# Patient Record
Sex: Male | Born: 1960 | Race: White | Hispanic: No | Marital: Married | Smoking: Current every day smoker
Health system: Southern US, Community
[De-identification: ages and names within clinical notes are randomized; demographics above are authoritative.]

## PROBLEM LIST (undated history)

## (undated) DIAGNOSIS — F1020 Alcohol dependence, uncomplicated: Secondary | ICD-10-CM

## (undated) DIAGNOSIS — J449 Chronic obstructive pulmonary disease, unspecified: Secondary | ICD-10-CM

## (undated) DIAGNOSIS — I10 Essential (primary) hypertension: Secondary | ICD-10-CM

## (undated) DIAGNOSIS — F419 Anxiety disorder, unspecified: Secondary | ICD-10-CM

## (undated) DIAGNOSIS — E119 Type 2 diabetes mellitus without complications: Secondary | ICD-10-CM

## (undated) DIAGNOSIS — J45909 Unspecified asthma, uncomplicated: Secondary | ICD-10-CM

## (undated) DIAGNOSIS — F32A Depression, unspecified: Secondary | ICD-10-CM

## (undated) HISTORY — DX: Anxiety disorder, unspecified: F41.9

## (undated) HISTORY — DX: Type 2 diabetes mellitus without complications: E11.9

## (undated) HISTORY — DX: Alcohol dependence, uncomplicated: F10.20

## (undated) HISTORY — DX: Chronic obstructive pulmonary disease, unspecified: J44.9

## (undated) HISTORY — DX: Depression, unspecified: F32.A

---

## 2005-09-20 DIAGNOSIS — I82409 Acute embolism and thrombosis of unspecified deep veins of unspecified lower extremity: Secondary | ICD-10-CM

## 2005-09-20 HISTORY — DX: Acute embolism and thrombosis of unspecified deep veins of unspecified lower extremity: I82.409

## 2019-06-29 ENCOUNTER — Encounter: Payer: Self-pay | Admitting: Emergency Medicine

## 2019-06-29 ENCOUNTER — Other Ambulatory Visit: Payer: Self-pay

## 2019-06-29 ENCOUNTER — Emergency Department
Admission: EM | Admit: 2019-06-29 | Discharge: 2019-06-30 | Disposition: A | Discharge status: Home / Self Care | Payer: Self-pay | Attending: Emergency Medicine | Admitting: Emergency Medicine

## 2019-06-29 ENCOUNTER — Emergency Department: Payer: Self-pay

## 2019-06-29 DIAGNOSIS — Z046 Encounter for general psychiatric examination, requested by authority: Secondary | ICD-10-CM | POA: Insufficient documentation

## 2019-06-29 DIAGNOSIS — F10921 Alcohol use, unspecified with intoxication delirium: Secondary | ICD-10-CM | POA: Insufficient documentation

## 2019-06-29 DIAGNOSIS — J441 Chronic obstructive pulmonary disease with (acute) exacerbation: Secondary | ICD-10-CM | POA: Insufficient documentation

## 2019-06-29 DIAGNOSIS — I10 Essential (primary) hypertension: Secondary | ICD-10-CM | POA: Insufficient documentation

## 2019-06-29 DIAGNOSIS — Z7901 Long term (current) use of anticoagulants: Secondary | ICD-10-CM | POA: Insufficient documentation

## 2019-06-29 DIAGNOSIS — Z79899 Other long term (current) drug therapy: Secondary | ICD-10-CM | POA: Insufficient documentation

## 2019-06-29 DIAGNOSIS — Y908 Blood alcohol level of 240 mg/100 ml or more: Secondary | ICD-10-CM | POA: Insufficient documentation

## 2019-06-29 DIAGNOSIS — R600 Localized edema: Secondary | ICD-10-CM | POA: Insufficient documentation

## 2019-06-29 DIAGNOSIS — R45851 Suicidal ideations: Secondary | ICD-10-CM | POA: Insufficient documentation

## 2019-06-29 HISTORY — DX: Essential (primary) hypertension: I10

## 2019-06-29 HISTORY — DX: Unspecified asthma, uncomplicated: J45.909

## 2019-06-29 LAB — COMPREHENSIVE METABOLIC PANEL
ALT: 26 U/L (ref 0–44)
AST: 47 U/L — ABNORMAL HIGH (ref 15–41)
Albumin: 4.2 g/dL (ref 3.5–5.0)
Alkaline Phosphatase: 113 U/L (ref 38–126)
Anion gap: 15 (ref 5–15)
BUN: 8 mg/dL (ref 6–20)
CO2: 23 mmol/L (ref 22–32)
Calcium: 8.9 mg/dL (ref 8.9–10.3)
Chloride: 101 mmol/L (ref 98–111)
Creatinine, Ser: 0.61 mg/dL (ref 0.61–1.24)
GFR calc Af Amer: >60 mL/min (ref >60)
GFR calc non Af Amer: >60 mL/min (ref >60)
Glucose, Bld: 103 mg/dL — ABNORMAL HIGH (ref 70–99)
Potassium: 3.8 mmol/L (ref 3.5–5.1)
Sodium: 139 mmol/L (ref 135–145)
Total Bilirubin: 0.6 mg/dL (ref 0.3–1.2)
Total Protein: 7.9 g/dL (ref 6.5–8.1)

## 2019-06-29 LAB — URINE DRUG SCREEN, QUALITATIVE (ARMC ONLY)
Amphetamines, Ur Screen: NOT DETECTED
Barbiturates, Ur Screen: NOT DETECTED
Benzodiazepine, Ur Scrn: POSITIVE — AB
Cannabinoid 50 Ng, Ur ~~LOC~~: NOT DETECTED
Cocaine Metabolite,Ur ~~LOC~~: NOT DETECTED
MDMA (Ecstasy)Ur Screen: NOT DETECTED
Methadone Scn, Ur: NOT DETECTED
Opiate, Ur Screen: NOT DETECTED
Phencyclidine (PCP) Ur S: NOT DETECTED
Tricyclic, Ur Screen: NOT DETECTED

## 2019-06-29 LAB — CBC
HCT: 45.5 % (ref 39.0–52.0)
Hemoglobin: 15.6 g/dL (ref 13.0–17.0)
MCH: 32.3 pg (ref 26.0–34.0)
MCHC: 34.3 g/dL (ref 30.0–36.0)
MCV: 94.2 fL (ref 80.0–100.0)
Platelets: 119 10*3/uL — ABNORMAL LOW (ref 150–400)
RBC: 4.83 MIL/uL (ref 4.22–5.81)
RDW: 15.5 % (ref 11.5–15.5)
WBC: 6.1 10*3/uL (ref 4.0–10.5)
nRBC: 0 % (ref 0.0–0.2)

## 2019-06-29 LAB — LACTIC ACID, PLASMA
Lactic Acid, Venous: 2.5 mmol/L (ref 0.5–1.9)
Lactic Acid, Venous: 3 mmol/L (ref 0.5–1.9)

## 2019-06-29 LAB — ETHANOL: Alcohol, Ethyl (B): 355 mg/dL (ref <10)

## 2019-06-29 LAB — PROTIME-INR
INR: 5 (ref 0.8–1.2)
Prothrombin Time: 45.6 seconds — ABNORMAL HIGH (ref 11.4–15.2)

## 2019-06-29 MED ORDER — IPRATROPIUM-ALBUTEROL 0.5-2.5 (3) MG/3ML IN SOLN
3.0000 mL | Freq: Once | RESPIRATORY_TRACT | Status: AC
  Administered 2019-06-29: 3 mL via RESPIRATORY_TRACT
  Filled 2019-06-29: qty 3

## 2019-06-29 MED ORDER — METHYLPREDNISOLONE SODIUM SUCC 125 MG IJ SOLR
125.0000 mg | Freq: Once | INTRAMUSCULAR | Status: AC
  Administered 2019-06-29: 125 mg via INTRAVENOUS
  Filled 2019-06-29: qty 2

## 2019-06-29 MED ORDER — ALBUTEROL SULFATE (2.5 MG/3ML) 0.083% IN NEBU
2.5000 mg | INHALATION_SOLUTION | Freq: Once | RESPIRATORY_TRACT | Status: AC
  Administered 2019-06-29: 19:00:00 2.5 mg via RESPIRATORY_TRACT
  Filled 2019-06-29: qty 3

## 2019-06-29 MED ORDER — SODIUM CHLORIDE 0.9 % IV BOLUS
1000.0000 mL | Freq: Once | INTRAVENOUS | Status: AC
  Administered 2019-06-29: 1000 mL via INTRAVENOUS

## 2019-06-29 NOTE — ED Notes (Signed)
Pharmacy tech talking to patient.

## 2019-06-29 NOTE — ED Notes (Signed)
Pt sitting up and eating dinner tray at this time

## 2019-06-29 NOTE — ED Notes (Signed)
IVC/Consult ordered ?

## 2019-06-29 NOTE — ED Notes (Signed)
Jay Robinson, pt PCP called to update and advocate for pt admission. Speaking with MD Siadecki at this time

## 2019-06-29 NOTE — ED Triage Notes (Signed)
Pt via EMS from home with SI and + ETOH intoxication today. Per EMS pt was found to be combative and threatening on scene. EMS received orders from EDP for 10mg  of haldol and 5mg  of versed IM in route. Pt is alert and agitated with slurred speech noted upon arrival. VSS, pt is on 2L from meds given.

## 2019-06-29 NOTE — ED Notes (Addendum)
Inhaler from bag given to pt per pt request and MD Siadecki request , inhaler is empty. MD aware

## 2019-06-29 NOTE — ED Notes (Signed)
Lab called and reported an latic of 3.0, MD informed orders issued.

## 2019-06-29 NOTE — ED Notes (Signed)
ED Provider at bedside. 

## 2019-06-29 NOTE — ED Notes (Signed)
Pt. Used bed side urinal.  Pt. Also given an additional cup of ice upon request.

## 2019-06-29 NOTE — ED Notes (Addendum)
Pt belongings consist of : zip lock bag full of bible, keys, 2 packs of cigarettes, inhaler, wallet, glasses, black cord and white cord , cell phone, papers, and sandals, sweat pants, tshirt, and 2 silver rings, 1 gold wedding band

## 2019-06-29 NOTE — ED Notes (Signed)
Pt denies any SI/HI or drug use with this RN

## 2019-06-29 NOTE — ED Provider Notes (Signed)
[facility] Emergency Department Provider Note ____________________________________________   First MD Initiated Contact with Patient 06/29/19 1638     (approximate)  I have reviewed the triage vital signs and the nursing notes.   HISTORY  Chief Complaint Suicidal and Alcohol Intoxication  Level 5 caveat: History of present illness limited due to alcohol intoxication  HPI Jay Robinson is a 58 y.o. male with a history of alcohol abuse as well as hypertension, diabetes, COPD, and right lower extremity DVT on Coumadin presents with alcohol intoxication.  Per EMS, the patient was combative and threatening at the scene and required sedation with 10 mg of Haldol and 5 mg of Versed.  Apparently he had some suicidal ideation expressed to EMS, but denies it currently.  The patient has right lower extremity swelling and states that his been there for about 2 weeks.  He went to [facility] to be evaluated but does not remember what they told him.  He denies any other acute medical complaints.  Past Medical History:  Diagnosis Date  . Asthma   . Hypertension     There are no active problems to display for this patient.   History reviewed. No pertinent surgical history.  Prior to Admission medications   Medication Sig Start Date End Date Taking? Authorizing Provider  albuterol (VENTOLIN HFA) 108 (90 Base) MCG/ACT inhaler Inhale 2 puffs into the lungs every 6 (six) hours as needed. 11/08/12  Yes [provider]  budesonide-formoterol (SYMBICORT) 160-4.5 MCG/ACT inhaler Inhale 2 puffs into the lungs 2 (two) times daily.   Yes [provider]  desvenlafaxine (PRISTIQ) 50 MG 24 hr tablet Take 50 mg by mouth daily.   Yes [provider]  LORazepam (ATIVAN) 1 MG tablet 1-2 tabs Q4 PRN anxiety 05/09/19  Yes [provider]  metoprolol succinate (TOPROL-XL) 25 MG 24 hr tablet Take 25 mg by mouth Nightly.   Yes [provider]   mirtazapine (REMERON) 7.5 MG tablet Take 7.5 mg by mouth at bedtime.   Yes [provider]  Multiple Vitamin (MULTIVITAMIN WITH MINERALS) TABS tablet Take 1 tablet by mouth daily.   Yes [provider]  tiotropium (SPIRIVA) 18 MCG inhalation capsule Place 18 mcg into inhaler and inhale daily.   Yes [provider]  vitamin B-12 (CYANOCOBALAMIN) 1000 MCG tablet Take 1,000 mcg by mouth daily.   Yes [provider]  warfarin (COUMADIN) 7.5 MG tablet Take 7.5 mg by mouth every evening.   Yes [provider]    Allergies Aspirin and Penicillins  No family history on file.  Social History Social History   Tobacco Use  . Smoking status: Current Every Day Smoker    Types: Cigarettes  . Smokeless tobacco: Never Used  Substance Use Topics  . Alcohol use: Yes    Comment: every day   . Drug use: Not Currently    Review of Systems Level 5 caveat: Unable to obtain review of systems due to alcohol intoxication   ____________________________________________   PHYSICAL EXAM:  VITAL SIGNS: ED Triage Vitals  Enc Vitals Group     BP 06/29/19 1628 123/74     Pulse Rate 06/29/19 1626 95     Resp 06/29/19 1626 14     Temp 06/29/19 1628 (!) 97.5 F (36.4 C)     Temp Source 06/29/19 1628 Oral     SpO2 06/29/19 1626 (!) 88 %     Weight --      Height --  Head Circumference --      Peak Flow --      Pain Score 06/29/19 1628 0     Pain Loc --      Pain Edu? --      Excl. in [place]? --     Constitutional: Sleepy appearing, somewhat combative, but in no acute distress. Eyes: Conjunctivae are normal.  EOMI. Head: Atraumatic. Nose: No congestion/rhinnorhea. Mouth/Throat: Mucous membranes are dry.   Neck: Normal range of motion.  Cardiovascular: Normal rate, regular rhythm. Good peripheral circulation. Respiratory: Normal respiratory effort.  No retractions. Gastrointestinal: Soft and nontender.  Musculoskeletal: Extremities warm and well  perfused.  Right lower extremity with 2+ edema below the knee, erythema, and induration.  2+ DP pulses bilaterally.  No wounds or skin lesions. Neurologic: Slurred speech.  Motor intact in all extremities. Skin:  Skin is warm and dry. No rash noted. Psychiatric: Sleepy but labile and intermittently agitated.  ____________________________________________   LABS (all labs ordered are listed, but only abnormal results are displayed)  Labs Reviewed  COMPREHENSIVE METABOLIC PANEL - Abnormal; Notable for the following components:      Result Value   Glucose, Bld 103 (*)    AST 47 (*)    All other components within normal limits  ETHANOL - Abnormal; Notable for the following components:   Alcohol, Ethyl (B) 355 (*)    All other components within normal limits  CBC - Abnormal; Notable for the following components:   Platelets 119 (*)    All other components within normal limits  URINE DRUG SCREEN, QUALITATIVE ([facility] ONLY) - Abnormal; Notable for the following components:   Benzodiazepine, Ur Scrn POSITIVE (*)    All other components within normal limits  PROTIME-INR - Abnormal; Notable for the following components:   Prothrombin Time 45.6 (*)    INR 5.0 (*)    All other components within normal limits  LACTIC ACID, PLASMA - Abnormal; Notable for the following components:   Lactic Acid, Venous 2.5 (*)    All other components within normal limits  LACTIC ACID, PLASMA - Abnormal; Notable for the following components:   Lactic Acid, Venous 3.0 (*)    All other components within normal limits  LACTIC ACID, PLASMA   ____________________________________________  EKG   ____________________________________________  RADIOLOGY    ____________________________________________   PROCEDURES  Procedure(s) performed: No  Procedures  Critical Care performed: Yes  CRITICAL CARE Performed by: Arta Silence   Total critical care time: 30 minutes  Critical care time was  exclusive of separately billable procedures and treating other patients.  Critical care was necessary to treat or prevent imminent or life-threatening deterioration.  Critical care was time spent personally by me on the following activities: development of treatment plan with patient and/or surrogate as well as nursing, discussions with consultants, evaluation of patient's response to treatment, examination of patient, obtaining history from patient or surrogate, ordering and performing treatments and interventions, ordering and review of laboratory studies, ordering and review of radiographic studies, pulse oximetry and re-evaluation of patient's condition. ____________________________________________   INITIAL IMPRESSION / ASSESSMENT AND PLAN / ED COURSE  Pertinent labs & imaging results that were available during my care of the patient were reviewed by me and considered in my medical decision making (see chart for details).  58 year old male with PMH as noted above including a history of alcohol abuse, hypertension, COPD, and DVT presents with alcohol intoxication.  Per EMS, he was combative and agitated at the scene and required  sedation with Haldol and Versed.  He also expressed some SI.  It is not clear from the history exactly who called EMS or why.  I reviewed the past medical records in epic.  I do not see prior records at [facility].  The patient was evaluated at [facility] on 9/1 and 06/11/2019.  On 9/21 he presented with right lower extremity swelling and had a negative x-ray and DVT study.  Per the provided history, he is on Coumadin.  On exam the patient is alert but somewhat sleepy appearing after the medicine he received.  His vital signs are normal except the O2 saturation is in the high 80s on room air when he is asleep, again likely due to the medication he was given.  There is no visible trauma.  He has swelling, erythema, and warmth of the right lower leg below the knee.   Neurologic exam is nonfocal.  The patient denies SI at this time.  The patient is argumentative and combative.  Several times he yelled at me to take out his IV and demanded to leave.  I was able to verbally redirect him.  Because of the history of suicidal ideation and the patient's significant intoxication, he demonstrates evidence of acute danger to self at this time and does not have capacity to refuse care or be discharged AMA.  I will place him under involuntary commitment due to acute danger to self.  We will obtain labs for medical work-up and observe the patient for sobriety.  When he is sober he should be evaluated by psychiatry.  The right lower extremity swelling appears to be at least subacute, and the patient was recently negative for DVT.  We will check his INR and reassess the swelling once he is sober.  ----------------------------------------- 5:09 PM on 06/29/2019 -----------------------------------------  I received a phone call from the NP Joneen Boers who follows the patient closely.  She advises that he is drinking has escalated recently, he has been increasingly depressed, has talked about death multiple times, and has also made threatening statements towards his wife.  This confirms my concerns for acute danger to self and others.  She also confirmed the history of DVT and PE and the fact that the patient is on Coumadin.  ----------------------------------------- 12:34 AM on 06/30/2019 -----------------------------------------  The initial lactic acid was slightly elevated which I suspected was due to the patient's intoxication and agitation.  The INR is supratherapeutic although does not require reversal and the patient has no evidence of active bleeding.  The lab work-up was otherwise reassuring.  The patient rested comfortably for several hours.  He is now clinically sober.  He now states that although he did drink heavily today, the reason he decided to come to the  hospital was for his medical issues.  He reports acute shortness of breath consistent with his COPD, and states he has increased right lower extremity swelling even compared to recently.  On exam he has slightly increased work of breathing and diffuse wheezing bilaterally.  His heart rate is borderline elevated.  The repeat lactic acid after a fluid bolus was slightly higher.  The patient has received additional fluids and we will repeat the lactic acid again.  Overall I suspect that the patient is having a mild COPD exacerbation.  Given the fact that his INR is supratherapeutic and he had a recent negative ultrasound I have a lower suspicion for acute DVT.  The patient has no chest pain and I do not  suspect acute PE at this time.  I have ordered duo nebs, Solu-Medrol, and a chest x-ray and ultrasound of the right lower extremity to further evaluate.  If the patient's respiratory status improves and he does not require oxygen, and if the lactate clears, I expect that he could be medically cleared and await psychiatry evaluation.  However if the symptoms or abnormal findings persist he may require medical admission.  I signed the patient out to the oncoming physician Dr. Don PerkingVeronese.   __________________________________  Jay MortimerJames Laventure was evaluated in Emergency Department on 06/30/2019 for the symptoms described in the history of present illness. He was evaluated in the context of the global COVID-19 pandemic, which necessitated consideration that the patient might be at risk for infection with the SARS-CoV-2 virus that causes COVID-19. Institutional protocols and algorithms that pertain to the evaluation of patients at risk for COVID-19 are in a state of rapid change based on information released by regulatory bodies including the CDC and federal and state organizations. These policies and algorithms were followed during the patient's care in the ED.  ____________________________________________   FINAL  CLINICAL IMPRESSION(S) / ED DIAGNOSES  Final diagnoses:  Alcohol intoxication with delirium (HCC)  Suicidal ideation  COPD exacerbation (HCC)      NEW MEDICATIONS STARTED DURING THIS VISIT:  New Prescriptions   No medications on file     Note:  This document was prepared using Dragon voice recognition software and may include unintentional dictation errors.    Dionne BucySiadecki, Amandeep Hogston, MD 06/30/19 [phone]

## 2019-06-30 ENCOUNTER — Emergency Department: Payer: Self-pay

## 2019-06-30 LAB — LACTIC ACID, PLASMA: Lactic Acid, Venous: 1.4 mmol/L (ref 0.5–1.9)

## 2019-06-30 MED ORDER — PREDNISONE 20 MG PO TABS: 60.0000 mg | ORAL_TABLET | Freq: Every day | ORAL | 0 refills | Status: AC

## 2019-06-30 MED ORDER — IPRATROPIUM-ALBUTEROL 0.5-2.5 (3) MG/3ML IN SOLN
3.0000 mL | Freq: Once | RESPIRATORY_TRACT | Status: AC
  Administered 2019-06-30: 04:00:00 3 mL via RESPIRATORY_TRACT
  Filled 2019-06-30: qty 3

## 2019-06-30 MED ORDER — ALBUTEROL SULFATE HFA 108 (90 BASE) MCG/ACT IN AERS: 2.0000 | INHALATION_SPRAY | Freq: Four times a day (QID) | RESPIRATORY_TRACT | 1 refills | Status: AC | PRN

## 2019-06-30 NOTE — ED Notes (Signed)
Awaiting IVC reversal.

## 2019-06-30 NOTE — ED Notes (Signed)
Patient alert and oriented. Up in room, awaiting ride.

## 2019-06-30 NOTE — Discharge Instructions (Signed)

## 2019-06-30 NOTE — ED Provider Notes (Addendum)
-----------------------------------------   1:22 AM on 06/30/2019 -----------------------------------------   Blood pressure (!) 128/103, pulse 95, temperature (!) 97.5 F (36.4 C), temperature source Oral, resp. rate 18, SpO2 96 %.  Assuming care from Dr. Cherylann Banas of Jay Robinson is a 57 y.o. male with a chief complaint of Suicidal and Alcohol Intoxication .    Please refer to H&P by previous MD for further details.  The current plan of care is to obtain ambulatory sats after duonebs for COPD exacerbation, f/u doppler US, and psych consult.   Patient feels markedly improved, move good air with no oxygen requirement. Sats > 95% at rest and with ambulation. Repeat lactic normal after IVF. Most likely due to high alcohol level/ dehydration. No signs of infection with no fever and normal CXR. Doppler US with no acute findings. Supratherapeutic INR with no evidence of bleeding. Recommended holding coumadin until Monday. Psych eval pending.   _________________________ 5:51 AM on 06/30/2019 -----------------------------------------  Psych cleared patient for dc. Patient's respiratory status remains stable with no oxygen requirement and normal WOB. Patient provided with prescription for albuterol and prednisone.  Discussed close follow-up and my standard return precautions.  Discussed with patient holding Coumadin until his INR is repeated on Monday.    I have personally reviewed the images performed during this visit and I agree with the Radiologist's read.   Interpretation by Radiologist:  US Venous Img Lower Unilateral Right  Result Date: 06/30/2019 CLINICAL DATA:  Right lower extremity pain and swelling EXAM: RIGHT LOWER EXTREMITY VENOUS DOPPLER ULTRASOUND TECHNIQUE: Gray-scale sonography with graded compression, as well as color Doppler and duplex ultrasound were performed to evaluate the lower extremity deep venous systems from the level of the common femoral vein and including the  common femoral, femoral, profunda femoral, popliteal and calf veins including the posterior tibial, peroneal and gastrocnemius veins when visible. The superficial great saphenous vein was also interrogated. Spectral Doppler was utilized to evaluate flow at rest and with distal augmentation maneuvers in the common femoral, femoral and popliteal veins. COMPARISON:  06/12/2019 FINDINGS: Contralateral Common Femoral Vein: Respiratory phasicity is normal and symmetric with the symptomatic side. No evidence of thrombus. Normal compressibility. Common Femoral Vein: Partially compressible common femoral vein with flow maintained and phasicity maintained. Saphenofemoral Junction: No evidence of thrombus. Normal compressibility and flow on color Doppler imaging. Profunda Femoral Vein: No evidence for thrombus. Femoral Vein/popliteal vein: No acute thrombus with flow maintained and phasicity maintained. Wall thickening with incompletely compressible vein. Calf Veins: The perineal an posterior tibial veins demonstrate incomplete compressibility but maintain color Doppler flow. Superficial Great Saphenous Vein: No evidence of thrombus. Normal compressibility and flow on color Doppler imaging. Other Findings: None. IMPRESSION: 1. No acute DVT. 2. 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 The appearance is very similar to 06/12/2019 ultrasound. Electronically Signed   By: Constance Holster M.D.   On: 06/30/2019 01:31   Dg Chest Portable 1 View  Result Date: 06/29/2019 CLINICAL DATA:  Shortness of breath with COPD. EXAM: PORTABLE CHEST 1 VIEW COMPARISON:  05/09/2011 FINDINGS: Moderate emphysematous changes are noted bilaterally. There is no pneumothorax. No focal infiltrate. No pleural effusion. The heart size is stable. There is no acute osseous abnormality. IMPRESSION: No acute cardiopulmonary process. Electronically Signed   By: Constance Holster M.D.   On: 06/29/2019 23:48   Alfred Levins, [place], MD 06/30/19 [place], [place], MD 06/30/19 [phone]

## 2021-02-07 IMAGING — US US EXTREM LOW VENOUS*R*
1 series · 13 of 24 positions shown · non-contrast
Comparison: 06/12/2019

CLINICAL DATA: Right lower extremity pain and swelling



[Series 1: us extrem low venous*right* · 13 of 48 slices shown]
[im 1/48]
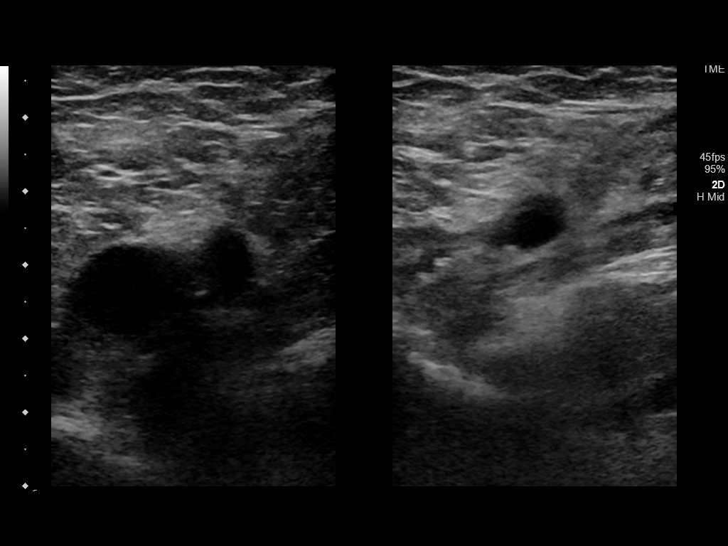
[im 5/48]
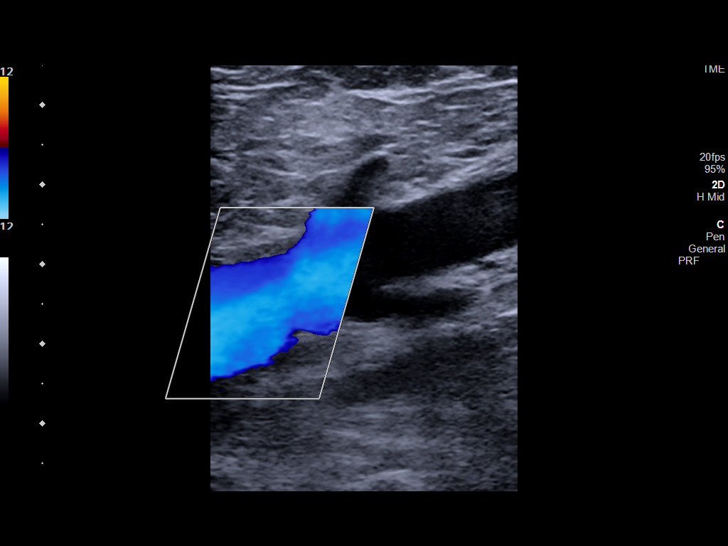
[im 9/48]
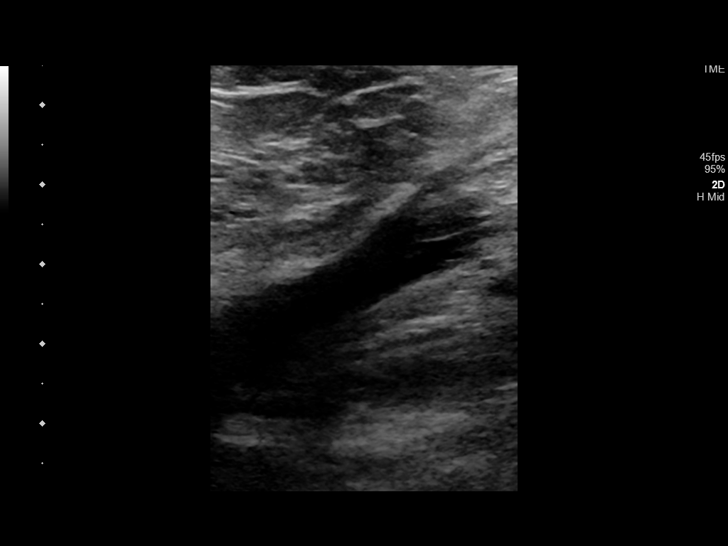
[im 13/48]
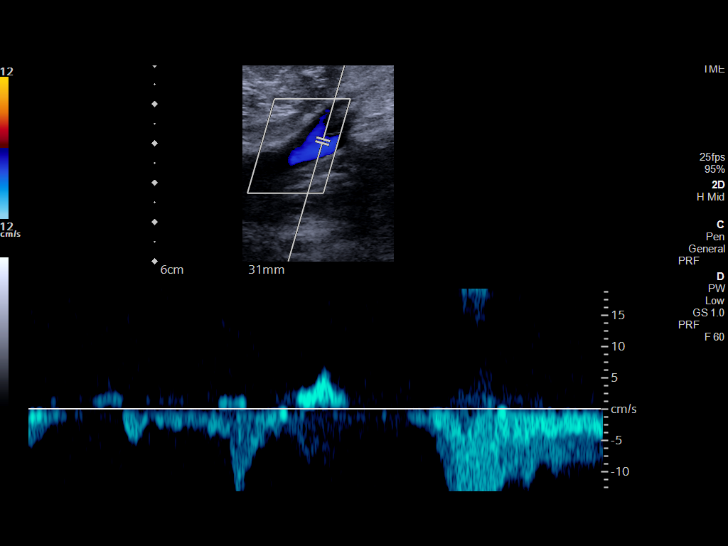
[im 17/48]
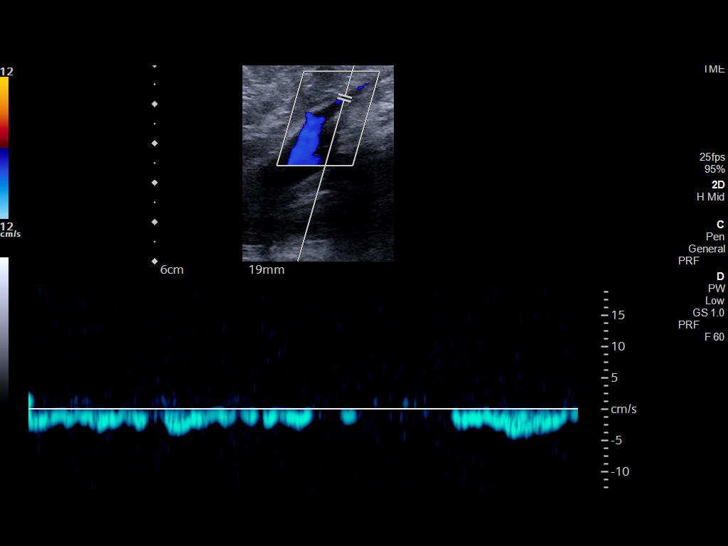
[im 21/48]
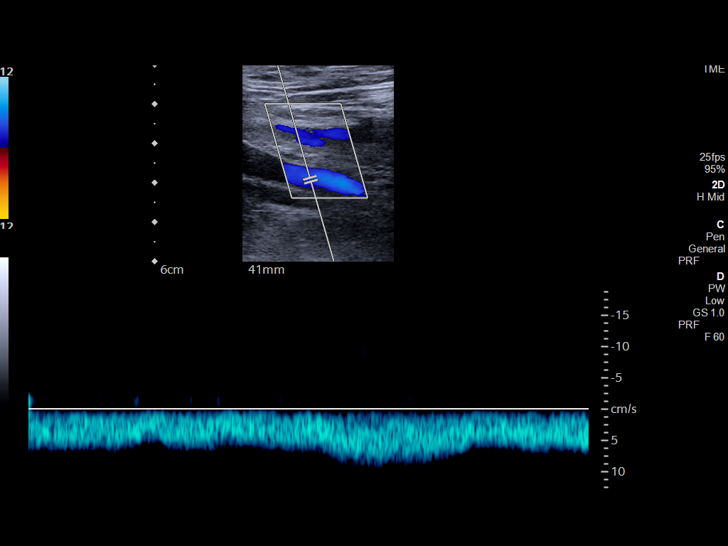
[im 25/48]
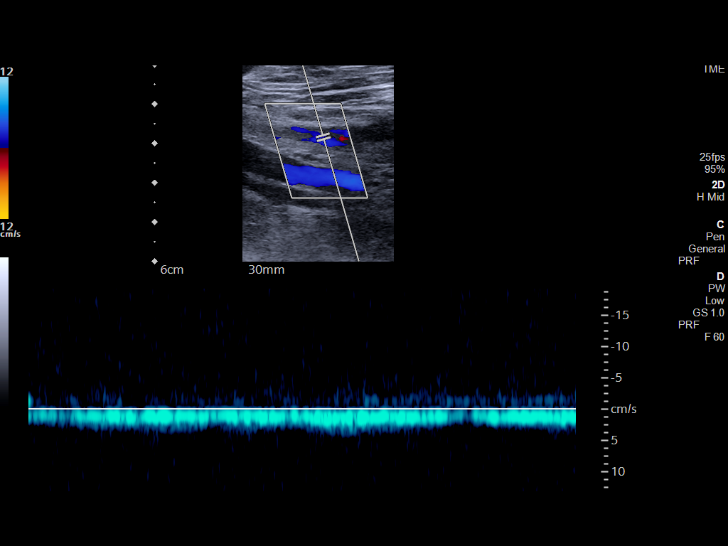
[im 27/48]
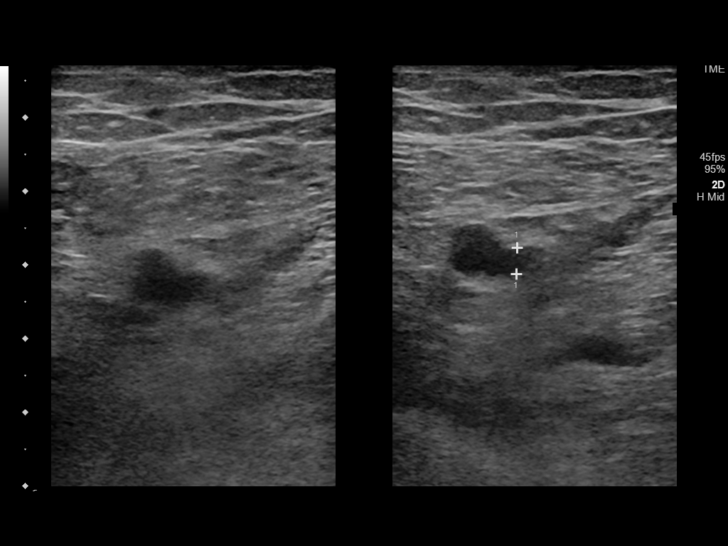
[im 31/48]
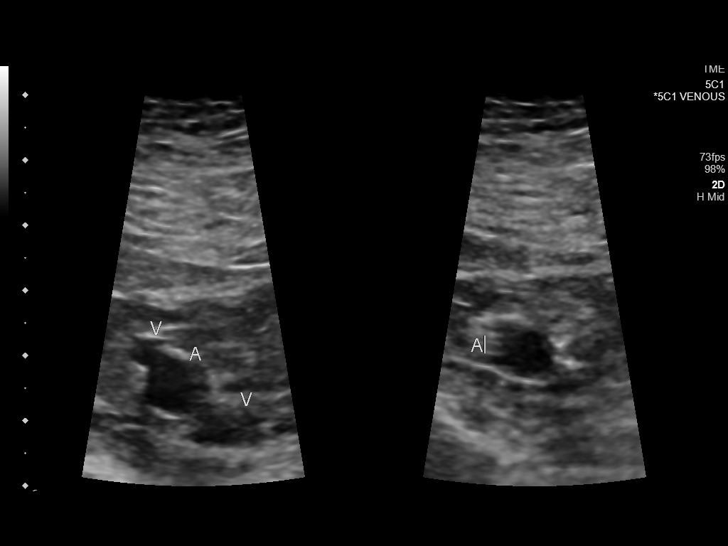
[im 35/48]
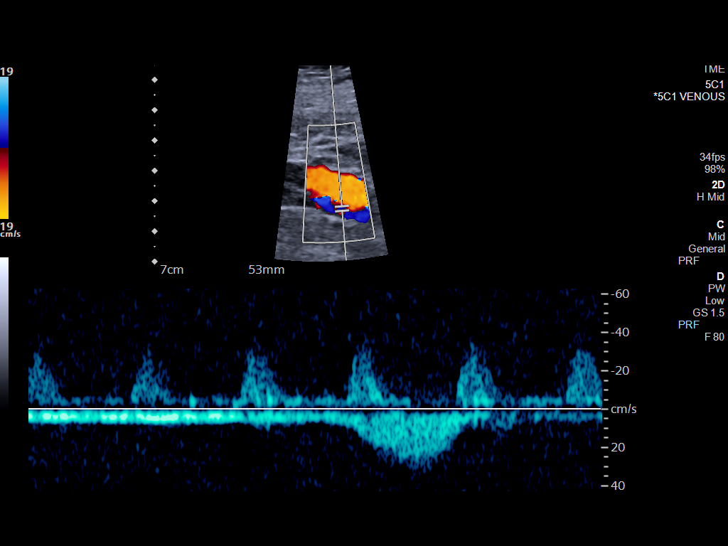
[im 39/48]
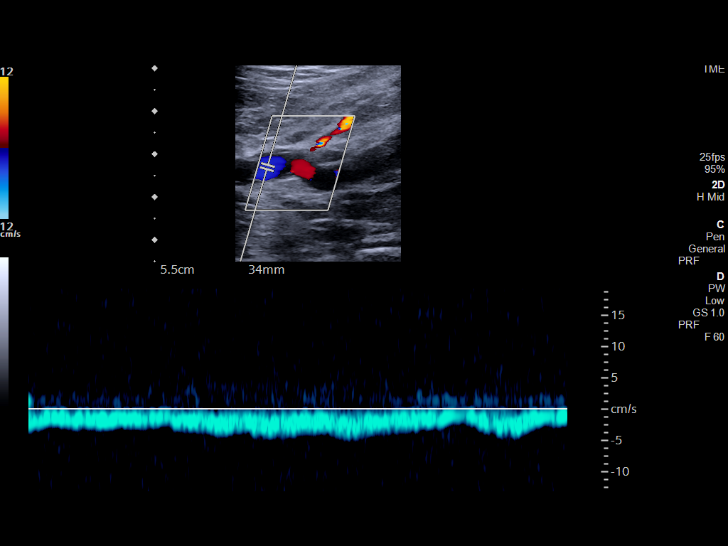
[im 43/48]
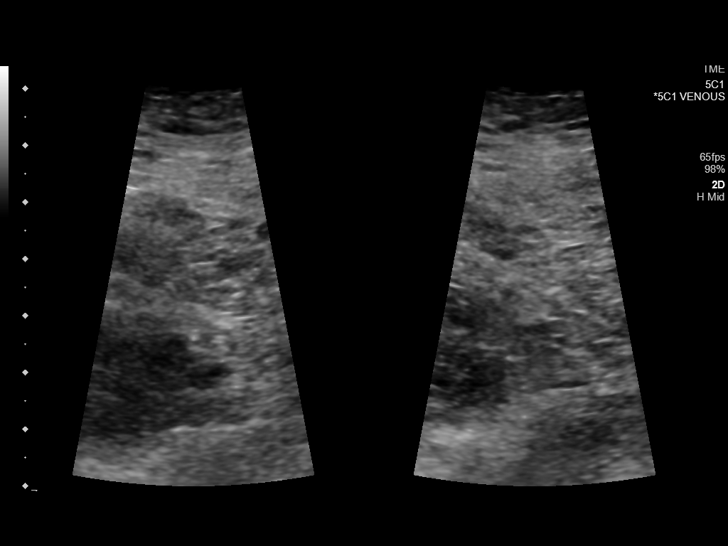
[im 48/48]
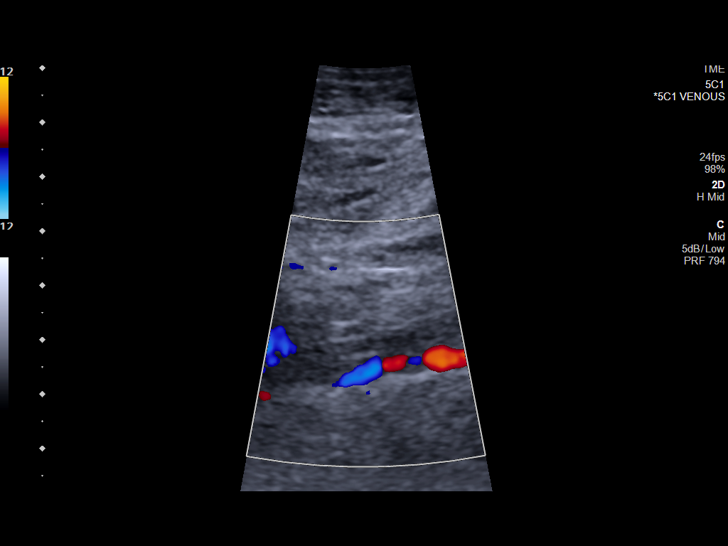

[13 of 24 positions shown; findings below may reference images not displayed]

FINDINGS: Contralateral Common Femoral Vein: Respiratory phasicity is normal
and symmetric with the symptomatic side. No evidence of thrombus.
Normal compressibility.

Common Femoral Vein: Partially compressible common femoral vein with
flow maintained and phasicity maintained.

Saphenofemoral Junction: No evidence of thrombus. Normal
compressibility and flow on color Doppler imaging.

Profunda Femoral Vein: No evidence for thrombus.

Femoral Vein/popliteal vein: No acute thrombus with flow maintained
and phasicity maintained. Wall thickening with incompletely
compressible vein.

Calf Veins: The perineal an posterior tibial veins demonstrate
incomplete compressibility but maintain color Doppler flow.

Superficial Great Saphenous Vein: No evidence of thrombus. Normal
compressibility and flow on color Doppler imaging.

Other Findings: None.
IMPRESSION: 1. No acute DVT.
2. Again noted is chronic DVT throughout the right lower extremity
as detailed above. The appearance is very similar to 06/12/2019
ultrasound.

## 2023-06-06 ENCOUNTER — Institutional Professional Consult (permissible substitution): Payer: BC Managed Care – PPO | Admitting: Student in an Organized Health Care Education/Training Program

## 2023-06-23 ENCOUNTER — Institutional Professional Consult (permissible substitution): Payer: BC Managed Care – PPO | Admitting: Student in an Organized Health Care Education/Training Program

## 2023-06-24 ENCOUNTER — Encounter: Payer: Self-pay | Admitting: Student in an Organized Health Care Education/Training Program

## 2023-06-27 ENCOUNTER — Ambulatory Visit
Admission: RE | Admit: 2023-06-27 | Discharge: 2023-06-27 | Disposition: A | Discharge status: Home / Self Care | Payer: BC Managed Care – PPO | Source: Ambulatory Visit | Attending: Pulmonary Disease | Admitting: Pulmonary Disease

## 2023-06-27 ENCOUNTER — Ambulatory Visit (INDEPENDENT_AMBULATORY_CARE_PROVIDER_SITE_OTHER): Payer: BC Managed Care – PPO | Admitting: Pulmonary Disease

## 2023-06-27 ENCOUNTER — Encounter: Payer: Self-pay | Admitting: Pulmonary Disease

## 2023-06-27 ENCOUNTER — Other Ambulatory Visit
Admission: RE | Admit: 2023-06-27 | Discharge: 2023-06-27 | Disposition: A | Discharge status: Home / Self Care | Payer: BC Managed Care – PPO | Source: Ambulatory Visit | Attending: Pulmonary Disease | Admitting: Pulmonary Disease

## 2023-06-27 VITALS — BP 118/78 | HR 81 | Temp 98.1°F | Ht 70.5 in | Wt 233.2 lb

## 2023-06-27 DIAGNOSIS — F319 Bipolar disorder, unspecified: Secondary | ICD-10-CM | POA: Diagnosis not present

## 2023-06-27 DIAGNOSIS — J302 Other seasonal allergic rhinitis: Secondary | ICD-10-CM

## 2023-06-27 DIAGNOSIS — R0602 Shortness of breath: Secondary | ICD-10-CM

## 2023-06-27 DIAGNOSIS — J441 Chronic obstructive pulmonary disease with (acute) exacerbation: Secondary | ICD-10-CM

## 2023-06-27 DIAGNOSIS — J9611 Chronic respiratory failure with hypoxia: Secondary | ICD-10-CM

## 2023-06-27 DIAGNOSIS — J449 Chronic obstructive pulmonary disease, unspecified: Secondary | ICD-10-CM | POA: Insufficient documentation

## 2023-06-27 DIAGNOSIS — F17210 Nicotine dependence, cigarettes, uncomplicated: Secondary | ICD-10-CM

## 2023-06-27 MED ORDER — METHYLPREDNISOLONE 4 MG PO TBPK: ORAL_TABLET | ORAL | 0 refills | Status: DC

## 2023-06-27 MED ORDER — BREZTRI AEROSPHERE 160-9-4.8 MCG/ACT IN AERO: 2.0000 | INHALATION_SPRAY | Freq: Two times a day (BID) | RESPIRATORY_TRACT | 0 refills | Status: DC

## 2023-06-27 NOTE — Progress Notes (Signed)
Subjective:    Patient ID: Jay Robinson, male    DOB: 02-Feb-1961, 62 y.o.   MRN: [id number]  Patient Care Team: Evie Lacks, NP (Inactive) as PCP - General (Nurse Practitioner)  Chief Complaint  Patient presents with   Consult    Has been told he has COPD. SOB- with exertion and in the mornings. Wheezing. Cough with tan to clear sputum.    HPI Patient is a 62 year old current smoker with 140-pack-year history of smoking who presents for evaluation of COPD.  The patient is kindly referred by Leroy Kennedy, PA-C.  The patient has been told in the past that he has COPD however he has never seen a pulmonologist and has never had PFTs.  He has noted shortness of breath particularly on exertion for quite a number of years but cannot easily quantitate.  He notes that walking up inclines or stairs is worse.  Doing any kind of exercise or yard work will also get him very short of breath.  Per his history he tells me that he has been admitted to [facility] for COPD exacerbation.  Most recently was on 30 March 2019 24 through 02 April 2023.  That admission was for alcohol withdrawal that he was also noted to be in acute exacerbation of COPD.  He was discharged home on oxygen at 3 L/min.  He has been on this oxygen since discharge.  He states that since that admission he has quit drinking.  He has also decreased his cigarette use to 2 cigarettes a day.  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  He has not had any recent fevers, chills or sweats.  No cough or purulent sputum production.  No hemoptysis.  No chest pain, no lower extremity edema nor calf tenderness.  He has a history of bipolar depression and has had suicidal attempts in the past.  There is a significant family history of emphysema even on non-smoking members of the family.  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  Review of Systems A 10 point review of systems was performed and it is as noted above otherwise negative.   Past Medical History:  Diagnosis Date   Alcoholic (HCC)    Anxiety    Asthma    COPD (chronic obstructive pulmonary disease) (HCC)    Depression    Diabetes mellitus (HCC)    DVT (deep venous thrombosis) (HCC) 2007   right leg   Hypertension     History reviewed. No pertinent surgical history.  There are no problems to display for this patient.   Family History  Problem Relation Age of Onset   COPD Mother    Heart disease Mother    Diabetes Mother    Clotting disorder Father    Heart disease Father    Stroke Father    Diabetes Sister    Diabetes Maternal Grandmother    Clotting disorder Paternal Grandfather     Social History   Tobacco Use   Smoking status: Every Day    Current packs/day: 3.00    Average packs/day: 3.0 packs/day for 46.8 years (140.3 ttl pk-yrs)    Types: Cigarettes    Start date: 35   Smokeless tobacco: Never   Tobacco comments:    Smoked 3 ppd.     Has smoked since he was 62 years  old    Smokes 2 cigarettes a day. 06/27/2023 khj  Substance Use Topics   Alcohol use: Yes    Comment: every day     Allergies  Allergen Reactions   Aspirin Other (See Comments)    Childhood allergy    Penicillins     Current Meds  Medication Sig   albuterol (PROVENTIL) (2.5 MG/3ML) 0.083% nebulizer solution Take 2.5 mg by nebulization every 4 (four) hours.   albuterol (VENTOLIN HFA) 108 (90 Base) MCG/ACT inhaler Inhale 2 puffs into the lungs every 6 (six) hours as needed for wheezing or shortness of breath.   ATROVENT HFA 17 MCG/ACT inhaler Inhale 1 puff into the lungs every 6 (six) hours as needed.   budesonide-formoterol (SYMBICORT) 160-4.5 MCG/ACT inhaler Inhale 2 puffs into the lungs 2 (two) times daily.   buPROPion (WELLBUTRIN SR) 150 MG 12 hr tablet Take 150 mg by mouth 2 (two) times daily.   busPIRone (BUSPAR) 10 MG tablet Take 10  mg by mouth 2 (two) times daily.   metFORMIN (GLUCOPHAGE) 500 MG tablet Take 500 mg by mouth 2 (two) times daily.   montelukast (SINGULAIR) 10 MG tablet Take 1 tablet by mouth daily.   sertraline (ZOLOFT) 50 MG tablet Take 50 mg by mouth daily.   traZODone (DESYREL) 50 MG tablet Take 50 mg by mouth at bedtime.   triamcinolone ointment (KENALOG) 0.1 % Apply 1 Application topically 2 (two) times daily.   XARELTO 20 MG TABS tablet Take 20 mg by mouth daily with supper.     There is no immunization history on file for this patient.      Objective:     BP 118/78 (BP Location: Right Arm, Cuff Size: Large)   Pulse 81   Temp 98.1 F (36.7 C)   Ht 5' 10.5" (1.791 m)   Wt 233 lb 3.2 oz (105.8 kg)   SpO2 95%   BMI 32.99 kg/m   SpO2: 95 % O2 Device: Nasal cannula O2 Flow Rate (L/min): 3 L/min O2 Type: Pulse O2  GENERAL: Well-developed, overweight gentleman, no acute distress.  Fully ambulatory.  No conversational dyspnea.  Does have a nasal quality to the speech. HEAD: Normocephalic, atraumatic.  EYES: Pupils equal, round, reactive to light.  No scleral icterus.  MOUTH: Dentition, oral mucosa moist.  Does significant mouth breathing. NECK: Supple. No thyromegaly. Trachea midline. No JVD.  No adenopathy. PULMONARY: Good air entry bilaterally.  Scattered wheezing throughout, no rhonchi.. CARDIOVASCULAR: S1 and S2. Regular rate and rhythm.  No rubs, murmurs or gallops heard. ABDOMEN: Obese, otherwise benign. MUSCULOSKELETAL: No joint deformity, no clubbing, no edema.  NEUROLOGIC: No overt focal deficit, no gait disturbance, speech is fluent. SKIN: Intact,warm,dry.  Significant stasis dermatitis bilaterally. PSYCH: Flat affect, mild psychomotor retardation.  Ambulatory oxymetry was performed today:  At rest on room air oxygen saturation was 96%, the patient ambulated at a moderate pace, completed 1 lap, O2 nadir 87%, moderate shortness of breath.  Patient was then placed on oxygen at 2  L/min via nasal cannula, completed 3 laps maintain oxygen saturations between 91 to 95% on 2 L/min.  Resting heart rate was 82 bpm at maximum for this exercise 88 bpm.  Chest x-ray performed today, independently reviewed, COPD changes with associated interstitial markings, cannot exclude right basilar opacity, atelectasis versus scarring, formal report pending:   Assessment & Plan:     ICD-10-CM   1. COPD suggested by initial evaluation [facility])  J44.9 Pulmonary Function Test [facility] Only  Alpha-1 antitrypsin phenotype    DG Chest 2 View   Will obtain PFTs, alpha-1 antitrypsin phenotype Discontinue Symbicort/Atrovent Trial of Breztri 2 puffs twice a day Continue as needed albuterol    2. COPD with acute exacerbation (HCC)  J44.1    Mild, no indication for antibiotics Bronchospasm noted today Medrol taper pack    3. Chronic respiratory failure with hypoxia (HCC)  J96.11    Needs 2 L/min with activity and sleep May be off O2 with rest, quiet activity (reading, watching TV)    4. Seasonal allergic rhinitis, unspecified trigger  J30.2    Discontinue montelukast Montelukast has significant psychiatric side effects Claritin as needed Nasal hygiene    5. Shortness of breath  R06.02 DG Chest 2 View   Suspect due to poorly compensated COPD PFTs as above Optimize LABA/ICS/LAMA therapy    6. Bipolar 1 disorder, depressed (HCC)  F31.9    This issue adds complexity to his management Discontinuing montelukast Montelukast has significant psychiatric side effects    7. Tobacco dependence due to cigarettes  F17.210 Ambulatory Referral for Lung Cancer Scre   Patient counseled regards to discontinuation of smoking Patient referred to lung cancer screening program      Orders Placed This Encounter  Procedures   DG Chest 2 View    Standing Status:   Future    Standing Expiration Date:   12/26/2023    Order Specific Question:   Reason for Exam (SYMPTOM  OR DIAGNOSIS REQUIRED)    Answer:    Shortness of breath    Order Specific Question:   Preferred imaging location?    Answer:   [facility]   Alpha-1 antitrypsin phenotype    Standing Status:   Future    Standing Expiration Date:   06/26/2024   Ambulatory Referral for Lung Cancer Scre    Referral Priority:   Routine    Referral Type:   Consultation    Referral Reason:   Specialty Services Required    Number of Visits Requested:   1   Pulmonary Function Test [facility] Only    Standing Status:   Future    Standing Expiration Date:   06/26/2024    Order Specific Question:   Full PFT: includes the following: basic spirometry, spirometry pre & post bronchodilator, diffusion capacity (DLCO), lung volumes    Answer:   Full PFT    Order Specific Question:   This test can only be performed at    Answer:   [facility]    Meds ordered this encounter  Medications   methylPREDNISolone (MEDROL DOSEPAK) 4 MG TBPK tablet    Sig: Take as directed in the package.  This is a taper pack.    Dispense:  21 tablet    Refill:  0   Budeson-Glycopyrrol-Formoterol (BREZTRI AEROSPHERE) 160-9-4.8 MCG/ACT AERO    Sig: Inhale 2 puffs into the lungs in the morning and at bedtime.    Dispense:  11.8 g    Refill:  0    Order Specific Question:   Lot Number?    Answer:   [id number]    Order Specific Question:   Expiration Date?    Answer:   10/21/2025    Order Specific Question:   Manufacturer?    Answer:   [employer] [[id number]]    Order Specific Question:   Quantity    Answer:   2   Patient will be seen in follow-up in 6 to 8 weeks time he is to contact  us prior to that time should any new difficulties arise.  Gailen Shelter, MD Advanced Bronchoscopy PCCM [facility]    *This note was dictated using voice recognition software/Dragon.  Despite best efforts to proofread, errors can occur which can change the meaning. Any transcriptional errors that result from this process are unintentional and may not be fully corrected at  the time of dictation.

## 2023-06-27 NOTE — Patient Instructions (Signed)
I recommend that you stop the Singulair (montelukast) this medication can interfere with your depression and medications for depression.  If you need something for allergy I would recommend Claritin over-the-counter medication has little interactions and it will not make you sleepy.  STOP the Symbicort and Atrovent.  We are giving you a trial of a medication called Markus Daft this is 2 puffs twice a day.  Make sure you rinse your mouth well after you use it.  Lease let us know how you do with this medication so we can call in into your pharmacy.  Your oxygen level at rest (just sitting quietly) is okay.  Ever when you walk you do still require oxygen but she only required 2 L/min.  Continue 2 L/min with activity (walking) and with sleep.  We have ordered breathing tests, a chest x-ray and a blood test.  The breathing tests will be scheduled.  Have your chest x-ray and your blood test done today.  We will see you in follow-up in 6 to 8 weeks time call sooner should any new problems arise.

## 2023-06-30 LAB — ALPHA-1-ANTITRYPSIN PHENOTYP: A-1 Antitrypsin, Ser: 143 mg/dL (ref 101–187)

## 2023-07-04 ENCOUNTER — Telehealth: Payer: Self-pay | Admitting: Pulmonary Disease

## 2023-07-04 NOTE — Telephone Encounter (Signed)
Patient is aware of below message/results and voiced his understanding. Nothing further needed.  ? ?

## 2023-07-04 NOTE — Telephone Encounter (Signed)
Called and spoke to patient. He is requesting CXR and lab results. He is aware that CXR has not been read by radiologist yet.  Dr. Jayme Cloud, please advise on labs. Thanks

## 2023-07-04 NOTE — Telephone Encounter (Signed)
Patient would like results of labwork and chest xray. Patient phone number is [phone].

## 2023-07-04 NOTE — Telephone Encounter (Signed)
Chest x-ray has still not been read by radiologist.  I do not see acute issues however need to wait till that formal interpretation.  All lab tests are reported at the same time to avoid multiple calls.  His alpha-1 was normal.

## 2023-07-12 ENCOUNTER — Telehealth: Payer: Self-pay | Admitting: Pulmonary Disease

## 2023-07-12 DIAGNOSIS — J9611 Chronic respiratory failure with hypoxia: Secondary | ICD-10-CM

## 2023-07-12 NOTE — Telephone Encounter (Signed)
Ok to place an order for a POC?

## 2023-07-12 NOTE — Telephone Encounter (Signed)
OK to send order

## 2023-07-12 NOTE — Telephone Encounter (Signed)
I just spoke with Mr. Adamian and he was asking about the order for POC

## 2023-07-12 NOTE — Telephone Encounter (Signed)
Patient was contacted by oxygen company that he qualifies for a POC but needs an order to be sent to [facility].

## 2023-07-13 NOTE — Telephone Encounter (Signed)
Order has been placed and I have notified the patient.  Nothing further needed.

## 2023-07-19 ENCOUNTER — Telehealth: Payer: Self-pay

## 2023-07-19 DIAGNOSIS — J9611 Chronic respiratory failure with hypoxia: Secondary | ICD-10-CM

## 2023-07-19 DIAGNOSIS — J449 Chronic obstructive pulmonary disease, unspecified: Secondary | ICD-10-CM

## 2023-07-19 NOTE — Telephone Encounter (Signed)
Patient advised and agreed to chest CT without contrast. Patient requesting for scan to be done at [facility].

## 2023-07-19 NOTE — Telephone Encounter (Signed)
-----   Message from Sarina Ser sent at 07/18/2023  2:16 PM EDT ----- Overall his chest x-ray done earlier shows COPD changes.  There is however an area that cannot be well-characterized on chest x-ray.  Recommend a chest CT without contrast.  This will give [place] a better view of this area.

## 2023-07-28 ENCOUNTER — Ambulatory Visit: Payer: BC Managed Care – PPO | Attending: Pulmonary Disease

## 2023-07-28 DIAGNOSIS — R0609 Other forms of dyspnea: Secondary | ICD-10-CM | POA: Insufficient documentation

## 2023-07-28 DIAGNOSIS — R942 Abnormal results of pulmonary function studies: Secondary | ICD-10-CM | POA: Diagnosis not present

## 2023-07-28 DIAGNOSIS — J449 Chronic obstructive pulmonary disease, unspecified: Secondary | ICD-10-CM | POA: Insufficient documentation

## 2023-07-28 DIAGNOSIS — R062 Wheezing: Secondary | ICD-10-CM | POA: Insufficient documentation

## 2023-07-28 LAB — PULMONARY FUNCTION TEST ARMC ONLY
DL/VA % pred: 77 %
DL/VA: 3.39 ml/min/mmHg/L
DLCO unc % pred: 67 %
DLCO unc: 14.35 ml/min/mmHg
FEF 25-75 Post: 0.58 L/s
FEF 25-75 Pre: 0.52 L/s
FEF2575-%Change-Post: 13 %
FEF2575-%Pred-Post: 25 %
FEF2575-%Pred-Pre: 22 %
FEV1-%Change-Post: 0 %
FEV1-%Pred-Post: 39 %
FEV1-%Pred-Pre: 39 %
FEV1-Post: 1.05 L
FEV1-Pre: 1.05 L
FEV1FVC-%Change-Post: -6 %
FEV1FVC-%Pred-Pre: 57 %
FEV6-%Change-Post: 6 %
FEV6-%Pred-Post: 75 %
FEV6-%Pred-Pre: 71 %
FEV6-Post: 2.51 L
FEV6-Pre: 2.36 L
FEV6FVC-%Change-Post: 0 %
FEV6FVC-%Pred-Post: 103 %
FEV6FVC-%Pred-Pre: 104 %
FVC-%Change-Post: 6 %
FVC-%Pred-Post: 73 %
FVC-%Pred-Pre: 68 %
FVC-Post: 2.56 L
FVC-Pre: 2.39 L
Post FEV1/FVC ratio: 41 %
Post FEV6/FVC ratio: 98 %
Pre FEV1/FVC ratio: 44 %
Pre FEV6/FVC Ratio: 99 %

## 2023-07-28 MED ORDER — ALBUTEROL SULFATE (2.5 MG/3ML) 0.083% IN NEBU
2.5000 mg | INHALATION_SOLUTION | Freq: Once | RESPIRATORY_TRACT | Status: AC
  Administered 2023-07-28: 2.5 mg via RESPIRATORY_TRACT
  Filled 2023-07-28: qty 3

## 2023-07-29 ENCOUNTER — Telehealth: Payer: Self-pay

## 2023-07-29 MED ORDER — BREZTRI AEROSPHERE 160-9-4.8 MCG/ACT IN AERO: 2.0000 | INHALATION_SPRAY | Freq: Two times a day (BID) | RESPIRATORY_TRACT | 3 refills | Status: DC

## 2023-07-29 NOTE — Telephone Encounter (Signed)
-----   Message from Sarina Ser sent at 07/29/2023 11:30 AM EST ----- His pulmonary function tests show that he has severe COPD he is at a stage 3 out of 4.  He needs to quit smoking as that is the main change that he can do to improve his lung health.  He has an upcoming appointment with me on 21 November and we can discuss these tests more in detail then.  For now continue Breztri 2 puffs twice a day and as needed albuterol and work on quitting smoking.

## 2023-07-29 NOTE — Telephone Encounter (Signed)
I have notified the patient. He said he does not have any more Breztri. He said it did help his breathing and he would like a refill sent to [facility] in [facility].  I have sent in the prescription.  Nothing further needed.

## 2023-08-09 ENCOUNTER — Ambulatory Visit
Admission: RE | Admit: 2023-08-09 | Discharge: 2023-08-09 | Disposition: A | Discharge status: Home / Self Care | Payer: BC Managed Care – PPO | Source: Ambulatory Visit | Attending: Pulmonary Disease | Admitting: Pulmonary Disease

## 2023-08-09 DIAGNOSIS — J449 Chronic obstructive pulmonary disease, unspecified: Secondary | ICD-10-CM | POA: Diagnosis present

## 2023-08-09 DIAGNOSIS — J9611 Chronic respiratory failure with hypoxia: Secondary | ICD-10-CM | POA: Insufficient documentation

## 2023-08-11 ENCOUNTER — Encounter: Payer: Self-pay | Admitting: Pulmonary Disease

## 2023-08-11 ENCOUNTER — Inpatient Hospital Stay
Admission: RE | Admit: 2023-08-11 | Discharge: 2023-08-11 | Disposition: A | Discharge status: Home / Self Care | Payer: Self-pay | Source: Ambulatory Visit | Attending: Pulmonary Disease | Admitting: Pulmonary Disease

## 2023-08-11 ENCOUNTER — Ambulatory Visit: Payer: BC Managed Care – PPO | Admitting: Pulmonary Disease

## 2023-08-11 ENCOUNTER — Telehealth: Payer: Self-pay

## 2023-08-11 VITALS — BP 120/78 | HR 98 | Temp 98.0°F | Ht 70.5 in | Wt 235.8 lb

## 2023-08-11 DIAGNOSIS — F17210 Nicotine dependence, cigarettes, uncomplicated: Secondary | ICD-10-CM

## 2023-08-11 DIAGNOSIS — J9611 Chronic respiratory failure with hypoxia: Secondary | ICD-10-CM

## 2023-08-11 DIAGNOSIS — R9389 Abnormal findings on diagnostic imaging of other specified body structures: Secondary | ICD-10-CM

## 2023-08-11 DIAGNOSIS — J449 Chronic obstructive pulmonary disease, unspecified: Secondary | ICD-10-CM | POA: Diagnosis not present

## 2023-08-11 DIAGNOSIS — R0602 Shortness of breath: Secondary | ICD-10-CM

## 2023-08-11 NOTE — Patient Instructions (Signed)
VISIT SUMMARY:  During today's visit, we discussed your severe COPD and reviewed your current treatment plan. You reported an improvement in your breathing with the use of Breztri, but you continue to smoke two cigarettes daily. We also reviewed your recent CT scan, which showed areas of emphysema and a change in the right lower lobe. Your lung function is currently at 39%.  YOUR PLAN:  -CHRONIC OBSTRUCTIVE PULMONARY DISEASE (COPD): COPD is a chronic lung disease that makes it hard to breathe. Your lung function is currently at 39%, which classifies your COPD as severe. You are currently using Breztri (two puffs twice a day) and supplemental oxygen (2 L/min). It is very important to stop smoking completely to prevent further damage to your lungs. We discussed the possibility of a new COPD medication if you quit smoking. You should also consider rejoining a pulmonary rehabilitation program. We will review your CT scan results with a radiologist and retrieve your prior CT scans for comparison. Please continue with your current medications and we will follow up in two months.  -PULMONARY SCARRING: Pulmonary scarring refers to the formation of scar tissue in the lungs. Your CT scan showed chronic scarring with cavitation in the right lower lobe, which appears unchanged from prior imaging in 2021. We are waiting for the radiologist's final report to confirm these findings. If the scarring is new, further testing such as a PET scan may be required. A biopsy might be considered, but it carries higher risks due to your existing lung condition. We will evaluate the need for further testing based on the radiologist's report and comparison with your previous scans.  INSTRUCTIONS:  Please schedule a follow-up appointment in two months. We will contact you if additional studies are required. Make sure to check your messages for any updates regarding your appointments and results.

## 2023-08-11 NOTE — Progress Notes (Unsigned)
Subjective:    Patient ID: Jay Robinson, male    DOB: 1960/11/04, 62 y.o.   MRN: [id number]  Patient Care Team: Leroy Kennedy, Cordelia Poche as PCP - General (Physician Assistant)  Chief Complaint  Patient presents with   Follow-up    DOE. Wheezing. Cough with clear/white sputum.     BACKGROUND/INTERVAL:Patient is a 62 year old current smoker with 140-pack-year history of smoking who presents for follow-up of COPD.  He was first evaluated here on 27 June 2023.  HPI Discussed the use of AI scribe software for clinical note transcription with the patient, who gave verbal consent to proceed.  History of Present Illness   The patient, diagnosed with severe COPD, has been using , Breztri, since his initial visit here on 27 June 2023. He reports a positive response to the medication, noting an improvement in his breathing. Despite this, he continues to smoke two cigarettes daily, a habit he finds aids in expectoration. He has been on oxygen therapy for approximately three to four months, set at two liters per minute.  This was prescribed at [facility] after a prior exacerbation and prior to his first visit here.  The patient notes that his oxygen saturation drops quickly when he engages in yard work.  The patient has a history of participating in a pulmonary rehabilitation program, which involved monitored exercise with and without supplemental oxygen. He has not participated in such a program recently.  A recent CT scan of the patient's lungs revealed areas of emphysema and a cavitary/scarring change in the right lower lobe. The nature of this change is unclear, as the patient's previous scans are currently inaccessible for comparison.  By report though it appears that this may be a chronic finding.  The patient does not report any new respiratory symptoms, and on examination, only a slight wheeze was noted. The patient's FEV1 is currently at 39%, classifying his COPD as severe.     Review  of Systems A 10 point review of systems was performed and it is as noted above otherwise negative.   There are no problems to display for this patient.   Social History   Tobacco Use   Smoking status: Every Day    Current packs/day: 3.00    Average packs/day: 3.0 packs/day for 46.9 years (140.7 ttl pk-yrs)    Types: Cigarettes    Start date: 20   Smokeless tobacco: Never   Tobacco comments:    Smoked 3 ppd.     Has smoked since he was 62 years old    Smokes 2 cigarettes a day. 08/11/2023 khj  Substance Use Topics   Alcohol use: Yes    Comment: every day     Allergies  Allergen Reactions   Aspirin Other (See Comments)    Childhood allergy    Penicillins     Current Meds  Medication Sig   albuterol (PROVENTIL) (2.5 MG/3ML) 0.083% nebulizer solution Take 2.5 mg by nebulization every 4 (four) hours.   albuterol (VENTOLIN HFA) 108 (90 Base) MCG/ACT inhaler Inhale 2 puffs into the lungs every 6 (six) hours as needed for wheezing or shortness of breath.   Budeson-Glycopyrrol-Formoterol (BREZTRI AEROSPHERE) 160-9-4.8 MCG/ACT AERO Inhale 2 puffs into the lungs in the morning and at bedtime.   buPROPion (WELLBUTRIN SR) 150 MG 12 hr tablet Take 150 mg by mouth 2 (two) times daily.   busPIRone (BUSPAR) 10 MG tablet Take 10 mg by mouth 2 (two) times daily.   metFORMIN (GLUCOPHAGE) 500 MG tablet  Take 500 mg by mouth 2 (two) times daily.   montelukast (SINGULAIR) 10 MG tablet Take 1 tablet by mouth daily.   sertraline (ZOLOFT) 50 MG tablet Take 50 mg by mouth daily.   traZODone (DESYREL) 50 MG tablet Take 50 mg by mouth at bedtime.   triamcinolone ointment (KENALOG) 0.1 % Apply 1 Application topically 2 (two) times daily.   XARELTO 20 MG TABS tablet Take 20 mg by mouth daily with supper.    Immunization History  Administered Date(s) Administered   Influenza-Unspecified 06/21/2023   RSV,unspecified 06/21/2023        Objective:     BP 120/78 (BP Location: Right Arm, Cuff  Size: Large)   Pulse 98   Temp 98 F (36.7 C)   Ht 5' 10.5" (1.791 m)   Wt 235 lb 12.8 oz (107 kg)   SpO2 94%   BMI 33.36 kg/m   SpO2: 94 % O2 Device: Nasal cannula O2 Flow Rate (L/min): 2 L/min O2 Type: Continuous O2  GENERAL: Well-developed, overweight gentleman, no acute distress.  Fully ambulatory.  No conversational dyspnea.  Does have a nasal quality to the speech. HEAD: Normocephalic, atraumatic.  EYES: Pupils equal, round, reactive to light.  No scleral icterus.  MOUTH: Dentition, oral mucosa moist.  Does significant mouth breathing. NECK: Supple. No thyromegaly. Trachea midline. No JVD.  No adenopathy. PULMONARY: Good air entry bilaterally.  Scattered wheezing throughout, no rhonchi.. CARDIOVASCULAR: S1 and S2. Regular rate and rhythm.  No rubs, murmurs or gallops heard. ABDOMEN: Obese, otherwise benign. MUSCULOSKELETAL: No joint deformity, no clubbing, no edema.  NEUROLOGIC: No overt focal deficit, no gait disturbance, speech is fluent. SKIN: Intact,warm,dry.  Significant stasis dermatitis bilaterally. PSYCH: Flat affect, mild psychomotor retardation.  Recent Results (from the past 2160 hour(s))  Alpha-1-Antitrypsin Phenotyp     Status: None   Collection Time: 06/27/23 11:39 AM  Result Value Ref Range   A-1 Antitrypsin Pheno MM     Comment: (NOTE) "MM" Phenotype is considered to be "normal", producing normal serum levels of alpha-1-protease inhibitor and not associated with clinical disease. Associated A1A total serum levels in other phenotypes and their incidence in the general population are shown in the table below. Phenotype  Population    % function      A-1-AT Conc.*           Incidence %  compared to MM  (Typical Range)   MM        86.5%           100%         (96 - 189)   MS         8.0%            86%         (83 - 161)   MZ         3.9%            61%         (60 - 111)   FM         0.4%           100%         (93 - 191)   SZ         0.3%             41%         (42 -  75)   SS         0.1%  64%         (62 - 119)   ZZ         0.05%           19%         (16 -  38)   FS         0.05%           70%         (70 - 128)   FZ        Unknown          46%         (44 -  88)   FF        Unknown                        Unknown *A-1-AT concentration in the homozygous MM phenotype is taken as th e reference normal. Percent deficiency in each phenotype is reported relative to this reference. Ranges used to confirm phenotype. Performed At: [facility] [address] [place], [place] [id number] Jolene Schimke MD ON:[id number]    A-1 Antitrypsin, Ser 143 101 - 187 mg/dL  Pulmonary Function Test [facility] Only     Status: None   Collection Time: 07/28/23 10:26 AM  Result Value Ref Range   FVC-Pre 2.39 L   FVC-%Pred-Pre 68 %   FVC-Post 2.56 L   FVC-%Pred-Post 73 %   FVC-%Change-Post 6 %   FEV1-Pre 1.05 L   FEV1-%Pred-Pre 39 %   FEV1-Post 1.05 L   FEV1-%Pred-Post 39 %   FEV1-%Change-Post 0 %   FEV6-Pre 2.36 L   FEV6-%Pred-Pre 71 %   FEV6-Post 2.51 L   FEV6-%Pred-Post 75 %   FEV6-%Change-Post 6 %   Pre FEV1/FVC ratio 44 %   FEV1FVC-%Pred-Pre 57 %   Post FEV1/FVC ratio 41 %   FEV1FVC-%Change-Post -6 %   Pre FEV6/FVC Ratio 99 %   FEV6FVC-%Pred-Pre 104 %   Post FEV6/FVC ratio 98 %   FEV6FVC-%Pred-Post 103 %   FEV6FVC-%Change-Post 0 %   FEF 25-75 Pre 0.52 L/sec   FEF2575-%Pred-Pre 22 %   FEF 25-75 Post 0.58 L/sec   FEF2575-%Pred-Post 25 %   FEF2575-%Change-Post 13 %   DLCO unc 14.35 ml/min/mmHg   DLCO unc % pred 67 %   DL/VA 4.40 ml/min/mmHg/L   DL/VA % pred 77 %    Representative image from CT performed 09 August 2023 showing the cystic/cavitary lesion on the right lower lobe with associated scarring of undetermined chronicity:     Assessment & Plan:     ICD-10-CM   1. Stage 3 severe COPD by GOLD classification (HCC)  J44.9     2. Chronic respiratory failure with hypoxia (HCC)  J96.11     3. Shortness of  breath  R06.02     4. Abnormal CT of the chest  R93.89     5. Tobacco dependence due to cigarettes  F17.210      Discussion:    Chronic Obstructive Pulmonary Disease (COPD) Severe COPD with lung function at 39%. Currently on Breztri (two puffs twice a day) and supplemental oxygen (2 L/min) for the past 3-4 months. Continued smoking (two cigarettes per day) exacerbates the condition. Emphysema present on CT scan. Discussed importance of complete smoking cessation to prevent further deterioration. Breztri may slightly improve lung function. Potential new COPD medication contingent on smoking cessation. - Continue Breztri (two puffs twice a day) - Encourage complete smoking  cessation - Discuss potential new COPD medication contingent on smoking cessation - Consider pulmonary rehabilitation program - Review CT scan results with radiologist - Retrieve prior CT scans for comparison - Schedule follow-up in two months  Pulmonary Scarring Chronic scarring with cavitation in the right lower lobe, maybe unchanged from prior imaging in 2021. Awaiting radiologist's final report to confirm findings and determine if further testing is needed. Order images from [facility]. If new, further testing such as a PET scan may be required. Biopsy poses higher risk due to existing lung disease, but other treatment options are available. - Await radiologist's report on current CT scan - Retrieve prior CT scans for comparison - Consider PET scan if new findings are noted - Evaluate need for biopsy based on further imaging results  Follow-up - Schedule follow-up appointment in two months - Contact patient if additional studies are required - Ensure patient receives messages regarding appointments and results.       Gailen Shelter, MD Advanced Bronchoscopy PCCM [facility]    *This note was generated using voice recognition software/Dragon and/or AI transcription program.  Despite  best efforts to proofread, errors can occur which can change the meaning. Any transcriptional errors that result from this process are unintentional and may not be fully corrected at the time of dictation.

## 2023-08-11 NOTE — Telephone Encounter (Signed)
CT chest from 2020 and 2021 requested from [facility] [place] via power share.

## 2023-08-24 NOTE — Telephone Encounter (Signed)
Lm for canopy.

## 2023-08-29 ENCOUNTER — Telehealth: Payer: Self-pay

## 2023-08-29 NOTE — Telephone Encounter (Signed)
Noted  

## 2023-08-29 NOTE — Telephone Encounter (Signed)
Message from [facility]  [place], Melissa M6 minutes ago (1:21 PM)   Radiology giving a call report on imaging    [facility] radiology [phone]  Halford Chessman M

## 2023-08-29 NOTE — Telephone Encounter (Signed)
Spoke to [place] with canopy and requested images to be pushed to PACs.  Routing to Dr. Jayme Cloud to make aware.

## 2023-08-29 NOTE — Telephone Encounter (Signed)
Yes I had spoken to Denyse Amass earlier he will also notify that [facility] radiology department.

## 2023-08-29 NOTE — Telephone Encounter (Deleted)
Radiology giving a call report on imaging

## 2023-08-30 NOTE — Telephone Encounter (Signed)
See telephone encounter from 12/9.  Closing this encounter.

## 2023-10-10 ENCOUNTER — Ambulatory Visit (INDEPENDENT_AMBULATORY_CARE_PROVIDER_SITE_OTHER): Payer: BC Managed Care – PPO | Admitting: Pulmonary Disease

## 2023-10-10 ENCOUNTER — Encounter: Payer: Self-pay | Admitting: Pulmonary Disease

## 2023-10-10 VITALS — BP 122/82 | HR 72 | Temp 96.9°F | Ht 70.5 in | Wt 240.4 lb

## 2023-10-10 DIAGNOSIS — F17210 Nicotine dependence, cigarettes, uncomplicated: Secondary | ICD-10-CM | POA: Diagnosis not present

## 2023-10-10 DIAGNOSIS — R9389 Abnormal findings on diagnostic imaging of other specified body structures: Secondary | ICD-10-CM

## 2023-10-10 DIAGNOSIS — J441 Chronic obstructive pulmonary disease with (acute) exacerbation: Secondary | ICD-10-CM | POA: Diagnosis not present

## 2023-10-10 DIAGNOSIS — J9611 Chronic respiratory failure with hypoxia: Secondary | ICD-10-CM

## 2023-10-10 DIAGNOSIS — J449 Chronic obstructive pulmonary disease, unspecified: Secondary | ICD-10-CM

## 2023-10-10 MED ORDER — METHYLPREDNISOLONE 4 MG PO TBPK: ORAL_TABLET | ORAL | 0 refills | Status: DC

## 2023-10-10 MED ORDER — AZITHROMYCIN 500 MG PO TABS
500.0000 mg | ORAL_TABLET | Freq: Every day | ORAL | 0 refills | Status: DC
Start: 2023-10-10 — End: 2023-10-10

## 2023-10-10 MED ORDER — AZITHROMYCIN 500 MG PO TABS
500.0000 mg | ORAL_TABLET | Freq: Every day | ORAL | 0 refills | Status: AC
Start: 2023-10-10 — End: 2023-10-13

## 2023-10-10 MED ORDER — METHYLPREDNISOLONE 4 MG PO TBPK: ORAL_TABLET | ORAL | 0 refills | Status: AC

## 2023-10-10 NOTE — Patient Instructions (Addendum)
VISIT SUMMARY:  During today's visit, we discussed your worsening shortness of breath and increased congestion, which have been exacerbated by recent outdoor work in cold weather. We reviewed your history of severe COPD and emphysema, and your current use of oxygen and Mucinex. We also talked about your significant progress in quitting smoking and your regular physical activity routine.  YOUR PLAN:  -COPD EXACERBATION: COPD exacerbation means a worsening of your chronic lung disease symptoms, such as increased shortness of breath and congestion. This can be triggered by factors like cold weather or illness. We will start you on a Medrol (methylprednisolone) taper and a non-penicillin antibiotic to manage your symptoms (Azithromycin 500 mg, 1 tablet daily for 3 days). Please use your rescue inhaler 20 minutes before walking. We will follow up in 3 months to monitor your progress.  -SMOKING CESSATION: Smoking cessation refers to the process of quitting smoking. You have made great progress, reducing your intake to 2-3 cigarettes a day. Continue your efforts to quit smoking completely, as this will greatly benefit your lung health.  -GENERAL HEALTH MAINTENANCE: Maintaining general health involves regular physical activity and healthy lifestyle choices. Continue your routine of walking with your spouse twice daily while using your oxygen support, as this is beneficial for your overall health and helps manage your COPD.  INSTRUCTIONS:  Please schedule a follow-up appointment in 3 months to monitor your COPD and overall health. Continue using your rescue inhaler 20 minutes before walking, and keep up with your efforts to quit smoking. If you experience any worsening symptoms or have concerns, contact our office immediately.

## 2023-10-10 NOTE — Progress Notes (Unsigned)
Subjective:    Patient ID: Jay Robinson, male    DOB: [birth year], 63 y.o.   MRN: [id number]  Patient Care Team: Leroy Kennedy, Cordelia Poche as PCP - General (Physician Assistant)  Chief Complaint  Patient presents with   Follow-up    SOB. Cough with clear sputum. Chest heaviness.     BACKGROUND/INTERVAL:Patient is a 63 year old current smoker with 140-pack-year history of smoking who presents for follow-up of COPD.  He was first evaluated here on 27 June 2023.  He was last seen on 11 August 2023.  HPI Discussed the use of AI scribe software for clinical note transcription with the patient, who gave verbal consent to proceed.  History of Present Illness   The patient with advanced and severe COPD presents with worsening shortness of breath and increased congestion over the past couple of weeks. He reports that these symptoms have been exacerbated by recent outdoor work in cold weather. The patient has been using Mucinex to manage the congestion, which has now become persistent throughout the day, rather than just in the mornings. He also reports a clear productive cough, particularly in the mornings.  The patient has a history of emphysema, as evidenced by previous scans. He has been prescribed prednisone in the past to manage his COPD symptoms. The patient has a known allergy to penicillin.  The patient has been attempting to quit smoking and has made significant progress, reducing his intake to two or three cigarettes a day. He reports that smoking now causes breathlessness, similar to when he first started smoking.  The patient is on three liters of oxygen and has been maintaining his oxygen levels. He has been engaging in regular physical activity, walking ten laps of his driveway twice a day with his spouse. He carries his oxygen during these walks and uses a rescue inhaler, which he has to refill on the twenty third. The patient reports that the walking has been beneficial, although  he needs to use his rescue inhaler.    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 07/28/2023 PFTs: FEV1 1.05 L or 39% predicted, FVC 2.39 L or 68% predicted, FEV1/FVC 44%, diffusion capacity is moderately reduced.  No bronchodilator response.  Patient was not able to perform maneuvers for lung volumes. 08/09/2023 CT chest without contrast: Diffuse advanced emphysematous lung changes.  Cavitary lesion on the right lower lobe which upon review of older films was present as far back as 2021.  Review of Systems A 10 point review of systems was performed and it is as noted above otherwise negative.   Patient Active Problem List   Diagnosis Date Noted   COPD with acute exacerbation (HCC) 10/12/2023   Stage 3 severe COPD by GOLD classification (HCC) 10/12/2023   Chronic respiratory failure with hypoxia (HCC) 10/12/2023   Abnormal CT of the chest 10/12/2023   Tobacco dependence due to cigarettes 10/12/2023    Social History   Tobacco Use   Smoking status: Every Day    Current packs/day: 3.00    Average packs/day: 3.0 packs/day for 47.1 years (141.2 ttl pk-yrs)    Types: Cigarettes    Start date: 1978   Smokeless tobacco: Never   Tobacco comments:    Smoked 3 ppd.     Has smoked since he was 63 years old    Smokes 2 cigarettes a day. 10/10/2023 khj  Substance Use Topics   Alcohol use: Yes    Comment: every day     Allergies  Allergen Reactions   Aspirin Other (See Comments)    Childhood allergy    Penicillins     Current Meds  Medication Sig   albuterol (PROVENTIL) (2.5 MG/3ML) 0.083% nebulizer solution Take 2.5 mg by nebulization every 4 (four) hours.   albuterol (VENTOLIN HFA) 108 (90 Base) MCG/ACT inhaler Inhale 2 puffs into the lungs every 6 (six) hours as needed for wheezing or shortness of breath.   azithromycin (ZITHROMAX) 500 MG tablet Take 1 tablet (500 mg total) by mouth daily for 3 days. Take 1 tablet daily for 3 days.    Budeson-Glycopyrrol-Formoterol (BREZTRI AEROSPHERE) 160-9-4.8 MCG/ACT AERO Inhale 2 puffs into the lungs in the morning and at bedtime.   buPROPion (WELLBUTRIN SR) 150 MG 12 hr tablet Take 150 mg by mouth 2 (two) times daily.   busPIRone (BUSPAR) 10 MG tablet Take 10 mg by mouth 2 (two) times daily.   clobetasol ointment (TEMOVATE) 0.05 % Apply 1 Application topically daily.   desonide (DESOWEN) 0.05 % ointment Apply 1 Application topically daily.   guaiFENesin (MUCINEX) 600 MG 12 hr tablet Take 600 mg by mouth daily.   metFORMIN (GLUCOPHAGE) 500 MG tablet Take 500 mg by mouth 2 (two) times daily.   montelukast (SINGULAIR) 10 MG tablet Take 1 tablet by mouth daily.   sertraline (ZOLOFT) 50 MG tablet Take 50 mg by mouth daily.   traZODone (DESYREL) 50 MG tablet Take 50 mg by mouth at bedtime.   triamcinolone ointment (KENALOG) 0.1 % Apply 1 Application topically 2 (two) times daily.   XARELTO 20 MG TABS tablet Take 20 mg by mouth daily with supper.    Immunization History  Administered Date(s) Administered   Influenza-Unspecified 06/21/2023   RSV,unspecified 06/21/2023        Objective:     BP 122/82 (BP Location: Right Arm, Cuff Size: Large)   Pulse 72   Temp (!) 96.9 F (36.1 C)   Ht 5' 10.5" (1.791 m)   Wt 240 lb 6.4 oz (109 kg)   SpO2 97%   BMI 34.01 kg/m   SpO2: 97 % O2 Device: Nasal cannula O2 Flow Rate (L/min): 3 L/min O2 Type: Pulse O2  GENERAL: Well-developed, overweight gentleman, no acute distress.  Fully ambulatory.  No conversational dyspnea.  Does have a nasal quality to the speech. HEAD: Normocephalic, atraumatic.  EYES: Pupils equal, round, reactive to light.  No scleral icterus.  MOUTH: Dentition, oral mucosa moist.  Does significant mouth breathing. NECK: Supple. No thyromegaly. Trachea midline. No JVD.  No adenopathy. PULMONARY: Good air entry bilaterally.  Scattered wheezing throughout, no rhonchi. CARDIOVASCULAR: S1 and S2. Regular rate and rhythm.   No rubs, murmurs or gallops heard. ABDOMEN: Obese, otherwise benign. MUSCULOSKELETAL: No joint deformity, no clubbing, no edema.  NEUROLOGIC: No overt focal deficit, no gait disturbance, speech is fluent. SKIN: Intact,warm,dry.  Significant stasis dermatitis bilaterally. PSYCH: Flat affect, mild psychomotor retardation.   Assessment & Plan:     ICD-10-CM   1. COPD with acute exacerbation (HCC)  J44.1     2. Stage 3 severe COPD by GOLD classification (HCC)  J44.9     3. Chronic respiratory failure with hypoxia (HCC)  J96.11     4. Abnormal CT of the chest  R93.89     5. Tobacco dependence due to cigarettes  F17.210      Meds ordered this encounter  Medications   methylPREDNISolone (MEDROL DOSEPAK) 4 MG TBPK tablet    Sig: Take as directed in the package.  This is a taper pack.    Dispense:  21 tablet    Refill:  0   azithromycin (ZITHROMAX) 500 MG tablet    Sig: Take 1 tablet (500 mg total) by mouth daily for 3 days. Take 1 tablet daily for 3 days.    Dispense:  3 tablet    Refill:  0   Discussion:    Chronic Obstructive Pulmonary Disease (COPD) Exacerbation   Severe and advanced COPD with recent exacerbation characterized by increased dyspnea, wheezing, and clear sputum production. Likely triggered by recent illness and outdoor activity. Currently on 3 liters of oxygen and using Mucinex. No signs of infection based on sputum color. Previous imaging shows consistent emphysema changes. Discussed risks and benefits of Medrol and antibiotics, emphasizing avoidance of hospitalization and ventilation, which he strongly prefers to avoid due to personal and family experiences.   - Prescribe Medrol (methylprednisolone) taper   - Prescribe non-penicillin antibiotic due to penicillin allergy (azithromycin 500 mg, 1 tablet daily x 3 days) - Advise use of rescue inhaler 20 minutes before walking laps - Follow-up in 3 months    Smoking Cessation   Actively attempting to quit smoking with  recent success of nearly two weeks without smoking. Currently smoking 2-3 cigarettes a day when he does slip. Reports difficulty smoking due to breathlessness, aiding cessation efforts.   - Continue to encourage smoking cessation efforts    General Health Maintenance   Engaging in regular physical activity by walking with his spouse twice daily, beneficial for overall health and COPD management.   - Continue regular walking routine with oxygen support    Follow-up   - Schedule follow-up appointment in 3 months.      Advised if symptoms do not improve or worsen, to please contact office for sooner follow up or seek emergency care.    I spent 42 minutes of dedicated to the care of this patient on the date of this encounter to include pre-visit review of records, face-to-face time with the patient discussing conditions above, post visit ordering of testing, clinical documentation with the electronic health record, making appropriate referrals as documented, and communicating necessary findings to members of the patients care team.     C. Danice Goltz, MD Advanced Bronchoscopy PCCM [facility]    *This note was generated using voice recognition software/Dragon and/or AI transcription program.  Despite best efforts to proofread, errors can occur which can change the meaning. Any transcriptional errors that result from this process are unintentional and may not be fully corrected at the time of dictation.

## 2023-10-12 DIAGNOSIS — R9389 Abnormal findings on diagnostic imaging of other specified body structures: Secondary | ICD-10-CM | POA: Insufficient documentation

## 2023-10-12 DIAGNOSIS — F17210 Nicotine dependence, cigarettes, uncomplicated: Secondary | ICD-10-CM | POA: Insufficient documentation

## 2023-10-12 DIAGNOSIS — J441 Chronic obstructive pulmonary disease with (acute) exacerbation: Secondary | ICD-10-CM | POA: Insufficient documentation

## 2023-10-12 DIAGNOSIS — J9611 Chronic respiratory failure with hypoxia: Secondary | ICD-10-CM | POA: Insufficient documentation

## 2023-10-12 DIAGNOSIS — J449 Chronic obstructive pulmonary disease, unspecified: Secondary | ICD-10-CM | POA: Insufficient documentation

## 2024-01-11 ENCOUNTER — Ambulatory Visit: Payer: BC Managed Care – PPO | Admitting: Pulmonary Disease

## 2024-02-08 ENCOUNTER — Telehealth: Payer: Self-pay | Admitting: Acute Care

## 2024-02-08 DIAGNOSIS — Z122 Encounter for screening for malignant neoplasm of respiratory organs: Secondary | ICD-10-CM

## 2024-02-08 DIAGNOSIS — F17210 Nicotine dependence, cigarettes, uncomplicated: Secondary | ICD-10-CM

## 2024-02-08 DIAGNOSIS — Z87891 Personal history of nicotine dependence: Secondary | ICD-10-CM

## 2024-02-08 NOTE — Telephone Encounter (Signed)
 Lung Cancer Screening Narrative/Criteria Questionnaire (Cigarette Smokers Only- No Cigars/Pipes/vapes)   Jay Robinson   SDMV:02/27/2024 9:00 [name]       [birth year]   LDCT: 02/28/2024 9:30 [facility]IC    63 y.o.   Phone: [phone]  Lung Screening Narrative (confirm age 51-77 yrs Medicare / 50-80 yrs Private pay insurance)   Insurance information:medicare A and BCBS   Referring Provider:Dr. Viva Grise   This screening involves an initial phone call with a team member from our program. It is called a shared decision making visit. The initial meeting is required by  insurance and Medicare to make sure you understand the program. This appointment takes about 15-20 minutes to complete. You will complete the screening scan at your scheduled date/time.  This scan takes about 5-10 minutes to complete. You can eat and drink normally before and after the scan.  Criteria questions for Lung Cancer Screening:   Are you a current or former smoker? Current Age began smoking: 63yo   If you are a former smoker, what year did you quit smoking? N/A(within 15 yrs)   To calculate your smoking history, I need an accurate estimate of how many packs of cigarettes you smoked per day and for how many years. (Not just the number of PPD you are now smoking)   Years smoking 48 x Packs per day 2 = Pack years 96   (at least 20 pack yrs)   (Make sure they understand that we need to know how much they have smoked in the past, not just the number of PPD they are smoking now)  Do you have a personal history of cancer?  No    Do you have a family history of cancer? No  Are you coughing up blood?  No  Have you had unexplained weight loss of 15 lbs or more in the last 6 months? No  It looks like you meet all criteria.  When would be a good time for us  to schedule you for this screening?   Additional information: N/A

## 2024-02-10 ENCOUNTER — Ambulatory Visit: Admitting: Pulmonary Disease

## 2024-02-27 ENCOUNTER — Encounter: Payer: Self-pay | Admitting: Emergency Medicine

## 2024-02-27 ENCOUNTER — Encounter

## 2024-02-28 ENCOUNTER — Ambulatory Visit: Admission: RE | Admit: 2024-02-28 | Source: Ambulatory Visit

## 2024-06-05 ENCOUNTER — Other Ambulatory Visit: Payer: Self-pay | Admitting: Pulmonary Disease

## 2024-07-31 ENCOUNTER — Other Ambulatory Visit: Payer: Self-pay | Admitting: Pulmonary Disease

## 2024-07-31 MED ORDER — BREZTRI AEROSPHERE 160-9-4.8 MCG/ACT IN AERO: 2.0000 | INHALATION_SPRAY | Freq: Two times a day (BID) | RESPIRATORY_TRACT | 3 refills | Status: AC

## 2024-07-31 NOTE — Telephone Encounter (Signed)
 Copied from CRM [id number]. Topic: Clinical - Medication Refill >> Jul 31, 2024 10:58 AM Dustin F wrote: Medication: budesonide-glycopyrrolate-formoterol (BREZTRI  AEROSPHERE) 160-9-4.8 MCG/ACT AERO inhaler  Pt stated it should be for 3 months not just 1 month due to his insurance costing less for a 3 month at a time maintenance supply.   Has the patient contacted their pharmacy? Yes (Agent: If no, request that the patient contact the pharmacy for the refill. If patient does not wish to contact the pharmacy document the reason why and proceed with request.) (Agent: If yes, when and what did the pharmacy advise?)  This is the patient's preferred pharmacy:    [id number], [place] - [address] [place] [address] Phone: [phone] Fax: [phone]  Is this the correct pharmacy for this prescription? Yes If no, delete pharmacy and type the correct one.   Has the prescription been filled recently? Yes  Is the patient out of the medication? No he said he has about a week supply left.  Has the patient been seen for an appointment in the last year OR does the patient have an upcoming appointment? Yes  Can we respond through MyChart? Yes  Agent: Please be advised that Rx refills may take up to 3 business days. We ask that you follow-up with your pharmacy.
